# Patient Record
Sex: Male | Born: 1965 | Hispanic: No | Marital: Married | Smoking: Never smoker
Health system: Southern US, Community
[De-identification: ages and names within clinical notes are randomized; demographics above are authoritative.]

## PROBLEM LIST (undated history)

## (undated) DIAGNOSIS — H6120 Impacted cerumen, unspecified ear: Secondary | ICD-10-CM

## (undated) DIAGNOSIS — R079 Chest pain, unspecified: Secondary | ICD-10-CM

## (undated) DIAGNOSIS — N529 Male erectile dysfunction, unspecified: Secondary | ICD-10-CM

## (undated) DIAGNOSIS — C801 Malignant (primary) neoplasm, unspecified: Secondary | ICD-10-CM

## (undated) DIAGNOSIS — E039 Hypothyroidism, unspecified: Secondary | ICD-10-CM

## (undated) DIAGNOSIS — R0789 Other chest pain: Secondary | ICD-10-CM

## (undated) DIAGNOSIS — R5383 Other fatigue: Secondary | ICD-10-CM

## (undated) DIAGNOSIS — M1990 Unspecified osteoarthritis, unspecified site: Secondary | ICD-10-CM

## (undated) HISTORY — DX: Unspecified osteoarthritis, unspecified site: M19.90

## (undated) HISTORY — PX: HERNIA REPAIR: SHX51

## (undated) HISTORY — DX: Other fatigue: R53.83

## (undated) HISTORY — PX: THYROIDECTOMY: SHX17

## (undated) HISTORY — DX: Chest pain, unspecified: R07.9

## (undated) HISTORY — DX: Other chest pain: R07.89

## (undated) HISTORY — DX: Malignant (primary) neoplasm, unspecified: C80.1

## (undated) HISTORY — DX: Male erectile dysfunction, unspecified: N52.9

## (undated) HISTORY — DX: Impacted cerumen, unspecified ear: H61.20

## (undated) HISTORY — PX: APPENDECTOMY: SHX54

---

## 2016-01-31 ENCOUNTER — Other Ambulatory Visit (HOSPITAL_COMMUNITY): Payer: Self-pay | Admitting: Internal Medicine

## 2016-01-31 DIAGNOSIS — R079 Chest pain, unspecified: Secondary | ICD-10-CM

## 2016-02-06 ENCOUNTER — Ambulatory Visit (INDEPENDENT_AMBULATORY_CARE_PROVIDER_SITE_OTHER): Payer: 59

## 2016-02-06 DIAGNOSIS — R079 Chest pain, unspecified: Secondary | ICD-10-CM

## 2016-02-06 LAB — EXERCISE TOLERANCE TEST
CHL CUP STRESS STAGE 1 GRADE: 0 %
CHL CUP STRESS STAGE 1 SPEED: 0 mph
CHL CUP STRESS STAGE 2 GRADE: 0 %
CHL CUP STRESS STAGE 2 HR: 80 {beats}/min
CHL CUP STRESS STAGE 2 SPEED: 1 mph
CHL CUP STRESS STAGE 3 GRADE: 0 %
CHL CUP STRESS STAGE 3 HR: 82 {beats}/min
CHL CUP STRESS STAGE 4 DBP: 70 mmHg
CHL CUP STRESS STAGE 4 SPEED: 1.7 mph
CHL CUP STRESS STAGE 5 GRADE: 12 %
CHL CUP STRESS STAGE 5 HR: 139 {beats}/min
CHL CUP STRESS STAGE 6 DBP: 72 mmHg
CHL CUP STRESS STAGE 6 GRADE: 14 %
CHL CUP STRESS STAGE 6 SPEED: 3.4 mph
CHL CUP STRESS STAGE 7 GRADE: 16 %
CHL CUP STRESS STAGE 7 SPEED: 4.2 mph
CHL CUP STRESS STAGE 8 GRADE: 0 %
CHL CUP STRESS STAGE 8 SPEED: 1.5 mph
CHL CUP STRESS STAGE 9 GRADE: 0 %
CHL CUP STRESS STAGE 9 HR: 100 {beats}/min
CHL CUP STRESS STAGE 9 SBP: 154 mmHg
CHL CUP STRESS STAGE 9 SPEED: 0 mph
Estimated workload: 13.4 METS
Exercise duration (min): 11 min
Exercise duration (sec): 0 s
MPHR: 169 {beats}/min
Peak HR: 166 {beats}/min
Percent HR: 98 %
Percent of predicted max HR: 98 %
RPE: 16
Rest HR: 59 {beats}/min
Stage 1 DBP: 86 mmHg
Stage 1 HR: 61 {beats}/min
Stage 1 SBP: 147 mmHg
Stage 3 Speed: 1 mph
Stage 4 Grade: 10 %
Stage 4 HR: 111 {beats}/min
Stage 4 SBP: 167 mmHg
Stage 5 DBP: 69 mmHg
Stage 5 SBP: 169 mmHg
Stage 5 Speed: 2.5 mph
Stage 6 HR: 151 {beats}/min
Stage 6 SBP: 172 mmHg
Stage 7 HR: 166 {beats}/min
Stage 8 DBP: 72 mmHg
Stage 8 HR: 139 {beats}/min
Stage 8 SBP: 158 mmHg
Stage 9 DBP: 75 mmHg

## 2017-02-12 ENCOUNTER — Other Ambulatory Visit: Payer: Self-pay | Admitting: Gastroenterology

## 2017-04-17 ENCOUNTER — Encounter (HOSPITAL_COMMUNITY): Payer: Self-pay

## 2017-04-21 ENCOUNTER — Ambulatory Visit (HOSPITAL_COMMUNITY): Payer: 59 | Admitting: Certified Registered Nurse Anesthetist

## 2017-04-21 ENCOUNTER — Encounter (HOSPITAL_COMMUNITY)
Admission: RE | Disposition: A | Discharge status: Home / Self Care | Payer: Self-pay | Source: Ambulatory Visit | Attending: Gastroenterology

## 2017-04-21 ENCOUNTER — Ambulatory Visit (HOSPITAL_COMMUNITY)
Admission: RE | Admit: 2017-04-21 | Discharge: 2017-04-21 | Disposition: A | Discharge status: Home / Self Care | Payer: 59 | Source: Ambulatory Visit | Attending: Gastroenterology | Admitting: Gastroenterology

## 2017-04-21 ENCOUNTER — Encounter (HOSPITAL_COMMUNITY): Payer: Self-pay | Admitting: *Deleted

## 2017-04-21 DIAGNOSIS — I10 Essential (primary) hypertension: Secondary | ICD-10-CM | POA: Insufficient documentation

## 2017-04-21 DIAGNOSIS — E039 Hypothyroidism, unspecified: Secondary | ICD-10-CM | POA: Diagnosis not present

## 2017-04-21 DIAGNOSIS — Z85850 Personal history of malignant neoplasm of thyroid: Secondary | ICD-10-CM | POA: Diagnosis not present

## 2017-04-21 DIAGNOSIS — Z1211 Encounter for screening for malignant neoplasm of colon: Secondary | ICD-10-CM | POA: Insufficient documentation

## 2017-04-21 HISTORY — DX: Hypothyroidism, unspecified: E03.9

## 2017-04-21 HISTORY — PX: COLONOSCOPY WITH PROPOFOL: SHX5780

## 2017-04-21 SURGERY — COLONOSCOPY WITH PROPOFOL
Anesthesia: Monitor Anesthesia Care

## 2017-04-21 MED ORDER — SODIUM CHLORIDE 0.9 % IV SOLN: INTRAVENOUS | Status: DC

## 2017-04-21 MED ORDER — PROPOFOL 500 MG/50ML IV EMUL
INTRAVENOUS | Status: DC | PRN
  Administered 2017-04-21: 150 ug/kg/min via INTRAVENOUS

## 2017-04-21 MED ORDER — PROPOFOL 10 MG/ML IV BOLUS
INTRAVENOUS | Status: AC
  Filled 2017-04-21: qty 20

## 2017-04-21 MED ORDER — LACTATED RINGERS IV SOLN
INTRAVENOUS | Status: DC
  Administered 2017-04-21: 10:00:00 via INTRAVENOUS

## 2017-04-21 MED ORDER — PROPOFOL 10 MG/ML IV BOLUS
INTRAVENOUS | Status: DC | PRN
  Administered 2017-04-21 (×3): 20 mg via INTRAVENOUS
  Administered 2017-04-21: 40 mg via INTRAVENOUS
  Administered 2017-04-21: 50 mg via INTRAVENOUS
  Administered 2017-04-21: 20 mg via INTRAVENOUS

## 2017-04-21 SURGICAL SUPPLY — 21 items

## 2017-04-21 NOTE — Anesthesia Preprocedure Evaluation (Addendum)
Anesthesia Evaluation  Patient identified by MRN, date of birth, ID band Patient awake    Reviewed: Allergy & Precautions, NPO status , Patient's Chart, lab work & pertinent test results  Airway Mallampati: III  TM Distance: <3 FB Neck ROM: Limited    Dental no notable dental hx.    Pulmonary neg pulmonary ROS,    Pulmonary exam normal breath sounds clear to auscultation       Cardiovascular negative cardio ROS Normal cardiovascular exam Rhythm:Regular Rate:Normal     Neuro/Psych negative neurological ROS  negative psych ROS   GI/Hepatic negative GI ROS, Neg liver ROS,   Endo/Other  negative endocrine ROSHypothyroidism   Renal/GU negative Renal ROS  negative genitourinary   Musculoskeletal negative musculoskeletal ROS (+)   Abdominal   Peds negative pediatric ROS (+)  Hematology negative hematology ROS (+)   Anesthesia Other Findings   Reproductive/Obstetrics negative OB ROS                            Anesthesia Physical Anesthesia Plan  ASA: II  Anesthesia Plan: MAC   Post-op Pain Management:    Induction: Intravenous  Airway Management Planned:   Additional Equipment:   Intra-op Plan:   Post-operative Plan:   Informed Consent: I have reviewed the patients History and Physical, chart, labs and discussed the procedure including the risks, benefits and alternatives for the proposed anesthesia with the patient or authorized representative who has indicated his/her understanding and acceptance.   Dental advisory given  Plan Discussed with: CRNA and Surgeon  Anesthesia Plan Comments:         Anesthesia Quick Evaluation

## 2017-04-21 NOTE — Op Note (Signed)
[facility] Patient Name: Hector Combs Procedure Date: 04/21/2017 MRN: [id number] Attending MD: Garlan Fair , MD Date of Birth: 1966/05/30 CSN: [id number] Age: 51 Admit Type: Outpatient Procedure:                Colonoscopy Indications:              Screening for colorectal malignant neoplasm Providers:                Garlan Fair, MD, Laverta Baltimore RN, RN,                            Cherylynn Ridges, Technician, Heide Scales, CRNA Referring MD:              Medicines:                Propofol per Anesthesia Complications:            No immediate complications. Estimated Blood Loss:     Estimated blood loss: none. Procedure:                Pre-Anesthesia Assessment:                           - Prior to the procedure, a History and Physical                            was performed, and patient medications and                            allergies were reviewed. The patient's tolerance of                            previous anesthesia was also reviewed. The risks                            and benefits of the procedure and the sedation                            options and risks were discussed with the patient.                            All questions were answered, and informed consent                            was obtained. Prior Anticoagulants: The patient has                            taken no previous anticoagulant or antiplatelet                            agents. ASA Grade Assessment: II - A patient with                            mild systemic disease. After reviewing the risks  and benefits, the patient was deemed in                            satisfactory condition to undergo the procedure.                           After obtaining informed consent, the colonoscope                            was passed under direct vision. Throughout the                            procedure, the patient's blood pressure, pulse, and                   oxygen saturations were monitored continuously. The                            EC-[id number] ([id number]) scope was introduced through                            the anus and advanced to the the cecum, identified                            by appendiceal orifice and ileocecal valve. The                            colonoscopy was performed without difficulty. The                            patient tolerated the procedure well. The quality                            of the bowel preparation was good. The terminal                            ileum, the ileocecal valve, the appendiceal orifice                            and the rectum were photographed. Scope In: 10:28:21 AM Scope Out: 10:41:30 AM Scope Withdrawal Time: 0 hours 9 minutes 2 seconds  Total Procedure Duration: 0 hours 13 minutes 9 seconds  Findings:      The perianal and digital rectal examinations were normal.      The entire examined colon appeared normal. Impression:               - The entire examined colon is normal.                           - No specimens collected. Moderate Sedation:      N/A- Per Anesthesia Care Recommendation:           - Patient has a contact number available for                            emergencies. The signs  and symptoms of potential                            delayed complications were discussed with the                            patient. Return to normal activities tomorrow.                            Written discharge instructions were provided to the                            patient.                           - Repeat colonoscopy in 10 years for screening                            purposes.                           - Resume previous diet.                           - Continue present medications. Procedure Code(s):        --- Professional ---                           Y2482, Colorectal cancer screening; colonoscopy on                            individual not meeting criteria  for high risk Diagnosis Code(s):        --- Professional ---                           Z12.11, Encounter for screening for malignant                            neoplasm of colon CPT copyright 2016 [employer]. All rights reserved. The codes documented in this report are preliminary and upon coder review may  be revised to meet current compliance requirements. Earle Gell, MD Garlan Fair, MD 04/21/2017 10:46:26 AM This report has been signed electronically. Number of Addenda: 0

## 2017-04-21 NOTE — H&P (Signed)
Procedure: Baseline screening colonoscopy.  History: The patient is a 51 year old male born 07/23/1966. He is scheduled to undergo a screening colonoscopy with polypectomy to prevent colon cancer.  Past medical history: Thyroidectomy performed to treat thyroid cancer in 2002. Appendectomy. Hernia repair. Hypertension.  Medication allergies: Sertraline caused insomnia  Exam: The patient is alert and lying comfortably on the endoscopy stretcher. Abdomen is soft and nontender to palpation. Lungs are clear to auscultation. Cardiac exam reveals a regular rhythm.  Plan: Proceed with screening colonoscopy

## 2017-04-21 NOTE — Transfer of Care (Signed)
Immediate Anesthesia Transfer of Care Note  Patient: Hector Combs  Procedure(s) Performed: Procedure(s): COLONOSCOPY WITH PROPOFOL (N/A)  Patient Location: PACU  Anesthesia Type:MAC  Level of Consciousness: Patient easily awoken, sedated, comfortable, cooperative, following commands, responds to stimulation.   Airway & Oxygen Therapy: Patient spontaneously breathing, ventilating well, oxygen via simple oxygen mask.  Post-op Assessment: Report given to PACU RN, vital signs reviewed and stable, moving all extremities.   Post vital signs: Reviewed and stable.  Complications: No apparent anesthesia complications  Last Vitals:  Vitals:   04/21/17 0930  BP: (!) 152/88  Pulse: 87  Resp: 18  Temp: 36.7 C    Last Pain:  Vitals:   04/21/17 0930  TempSrc: Oral         Complications: No apparent anesthesia complications

## 2017-04-21 NOTE — Anesthesia Postprocedure Evaluation (Signed)
Anesthesia Post Note  Patient: Hector Combs  Procedure(s) Performed: Procedure(s) (LRB): COLONOSCOPY WITH PROPOFOL (N/A)  Patient location during evaluation: PACU Anesthesia Type: MAC Level of consciousness: awake and alert Pain management: pain level controlled Vital Signs Assessment: post-procedure vital signs reviewed and stable Respiratory status: spontaneous breathing, nonlabored ventilation, respiratory function stable and patient connected to nasal cannula oxygen Cardiovascular status: stable and blood pressure returned to baseline Anesthetic complications: no       Last Vitals:  Vitals:   04/21/17 1100 04/21/17 1110  BP: 129/81 130/83  Pulse: 73 66  Resp: 13 10  Temp:      Last Pain:  Vitals:   04/21/17 1048  TempSrc: Oral                 [name] P [name]

## 2017-04-21 NOTE — Discharge Instructions (Signed)

## 2017-04-22 ENCOUNTER — Encounter (HOSPITAL_COMMUNITY): Payer: Self-pay | Admitting: Gastroenterology

## 2020-07-21 ENCOUNTER — Ambulatory Visit: Payer: No Typology Code available for payment source

## 2020-07-21 ENCOUNTER — Other Ambulatory Visit: Payer: Self-pay

## 2020-07-21 ENCOUNTER — Other Ambulatory Visit: Payer: Self-pay | Admitting: Internal Medicine

## 2020-07-21 ENCOUNTER — Ambulatory Visit
Admission: RE | Admit: 2020-07-21 | Discharge: 2020-07-21 | Disposition: A | Discharge status: Home / Self Care | Payer: No Typology Code available for payment source | Source: Ambulatory Visit | Attending: Internal Medicine | Admitting: Internal Medicine

## 2020-07-21 DIAGNOSIS — M546 Pain in thoracic spine: Secondary | ICD-10-CM

## 2020-07-25 ENCOUNTER — Other Ambulatory Visit: Payer: Self-pay | Admitting: Internal Medicine

## 2020-07-26 ENCOUNTER — Other Ambulatory Visit: Payer: Self-pay | Admitting: Internal Medicine

## 2020-07-26 DIAGNOSIS — M79602 Pain in left arm: Secondary | ICD-10-CM

## 2020-07-26 DIAGNOSIS — M5412 Radiculopathy, cervical region: Secondary | ICD-10-CM

## 2020-07-30 ENCOUNTER — Other Ambulatory Visit: Payer: Self-pay

## 2020-07-30 ENCOUNTER — Ambulatory Visit
Admission: RE | Admit: 2020-07-30 | Discharge: 2020-07-30 | Disposition: A | Discharge status: Home / Self Care | Payer: No Typology Code available for payment source | Source: Ambulatory Visit | Attending: Internal Medicine | Admitting: Internal Medicine

## 2020-07-30 DIAGNOSIS — M5412 Radiculopathy, cervical region: Secondary | ICD-10-CM

## 2020-07-30 DIAGNOSIS — M79602 Pain in left arm: Secondary | ICD-10-CM

## 2020-08-18 ENCOUNTER — Ambulatory Visit (INDEPENDENT_AMBULATORY_CARE_PROVIDER_SITE_OTHER): Payer: No Typology Code available for payment source | Admitting: Otolaryngology

## 2020-08-23 ENCOUNTER — Ambulatory Visit (INDEPENDENT_AMBULATORY_CARE_PROVIDER_SITE_OTHER): Payer: No Typology Code available for payment source | Admitting: Otolaryngology

## 2021-04-18 ENCOUNTER — Ambulatory Visit: Payer: No Typology Code available for payment source | Attending: Internal Medicine

## 2021-04-18 DIAGNOSIS — Z23 Encounter for immunization: Secondary | ICD-10-CM

## 2021-04-18 NOTE — Progress Notes (Signed)
   Covid-19 Vaccination Clinic  Name:  Hector Combs    MRN: [id number] DOB: 08/19/1966  04/18/2021  [name] was observed post Covid-19 immunization for 15 minutes without incident. He was provided with Vaccine Information Sheet and instruction to access the V-Safe system.   [name] was instructed to call 911 with any severe reactions post vaccine: Marland Kitchen Difficulty breathing  . Swelling of face and throat  . A fast heartbeat  . A bad rash all over body  . Dizziness and weakness

## 2021-04-23 ENCOUNTER — Other Ambulatory Visit (HOSPITAL_BASED_OUTPATIENT_CLINIC_OR_DEPARTMENT_OTHER): Payer: Self-pay

## 2021-04-23 MED ORDER — JANSSEN COVID-19 VACCINE 0.5 ML IM SUSP
INTRAMUSCULAR | 0 refills | Status: AC
  Filled 2021-04-23: qty 0.5, 1d supply, fill #0

## 2022-02-03 IMAGING — MR MR CERVICAL SPINE W/O CM
4 of 5 series · 28 of 48 positions shown · non-contrast
Comparison: 08/07/2009 CT

CLINICAL DATA: Neck pain with no injury. Numbness in the left arm
after driving

EXAM:
MRI CERVICAL SPINE WITHOUT CONTRAST
TECHNIQUE: Multiplanar, multisequence MR imaging of the cervical spine was
performed. No intravenous contrast was administered.

[Series 3: T2 · sagittal · 3.0mm · 0.66mm/px · 6 of 13 slices shown (1 of 2)]
[im 1/13]
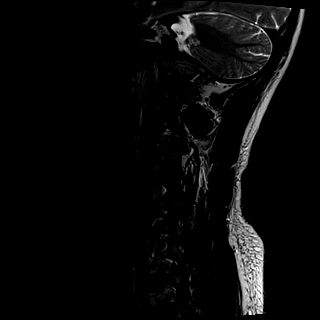
[im 3/13]
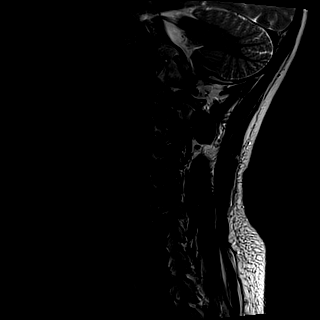
[im 5/13]
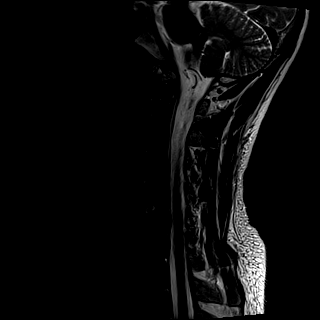
[im 8/13]
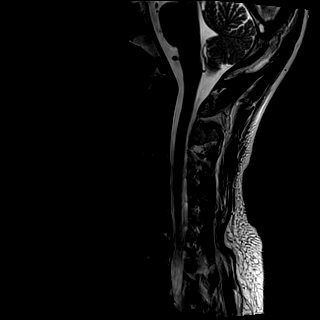
[im 10/13]
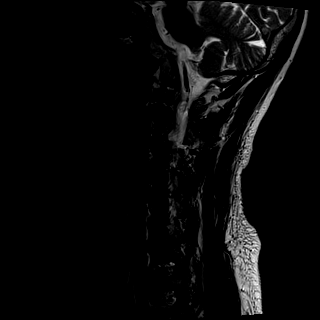
[im 13/13]
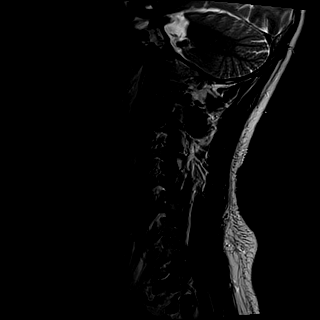

[Series 4: T1 · sagittal · 3.0mm · 0.41mm/px · 7 of 13 slices shown]
[im 1/13]
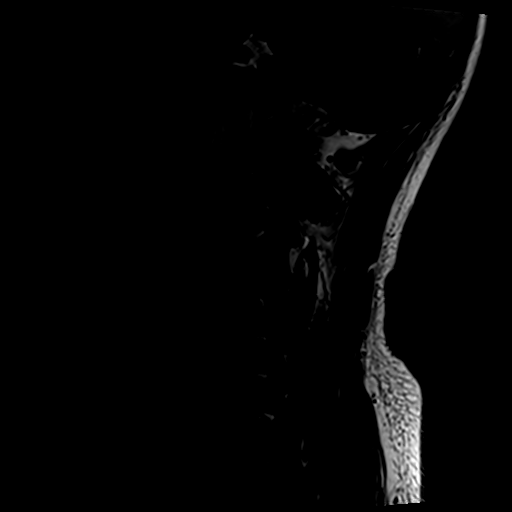
[im 3/13]
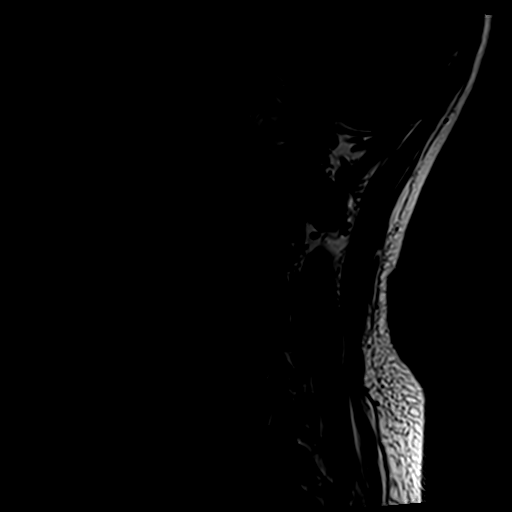
[im 5/13]
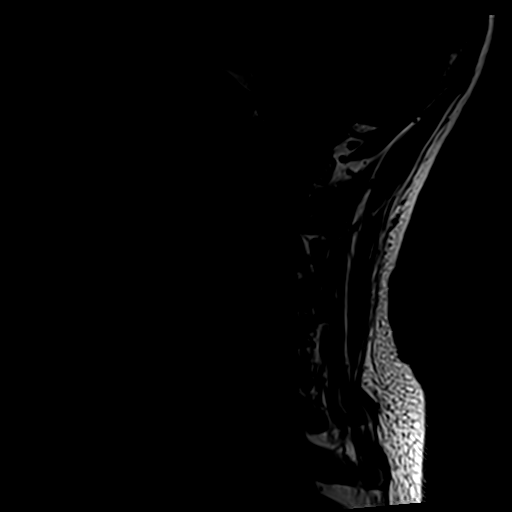
[im 7/13]
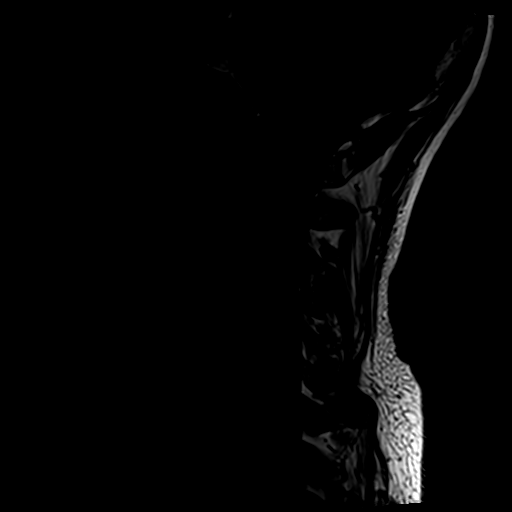
[im 9/13]
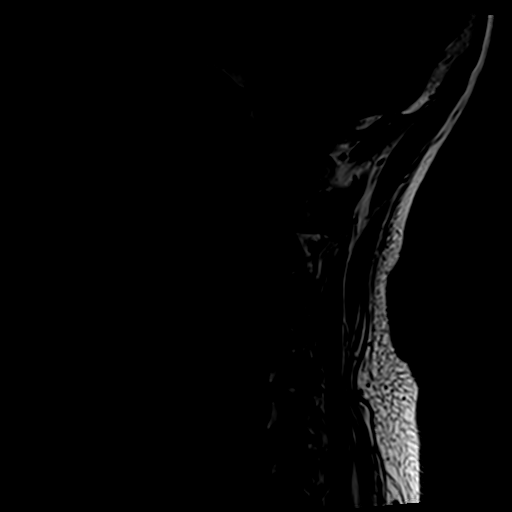
[im 11/13]
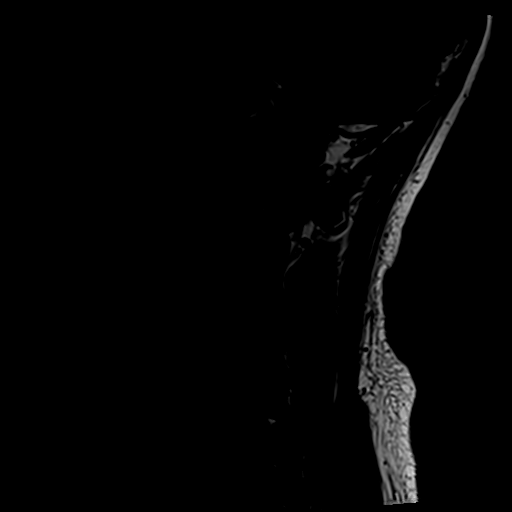
[im 13/13]
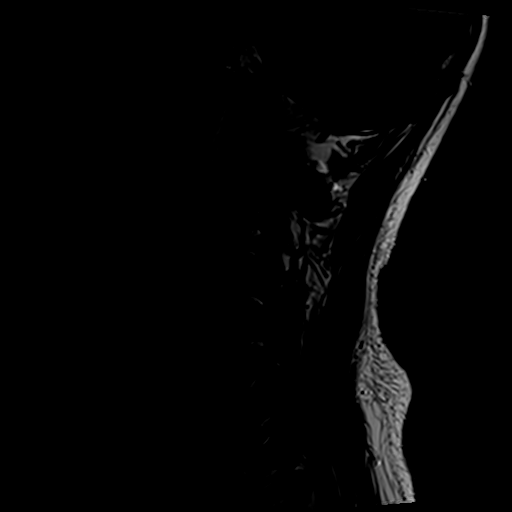

[Series 5: tir sag · sagittal · 3.0mm · 0.41mm/px · 7 of 13 slices shown]
[im 1/13]
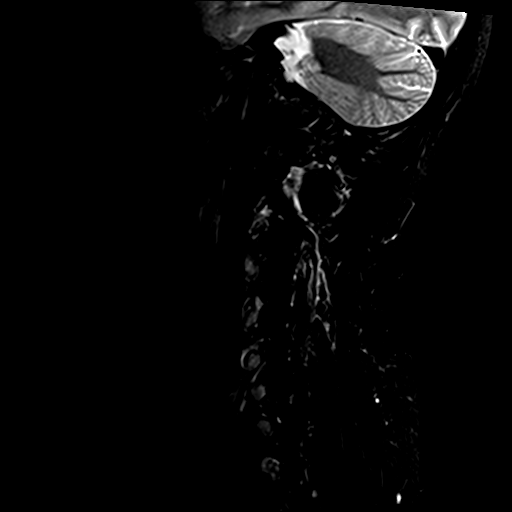
[im 3/13]
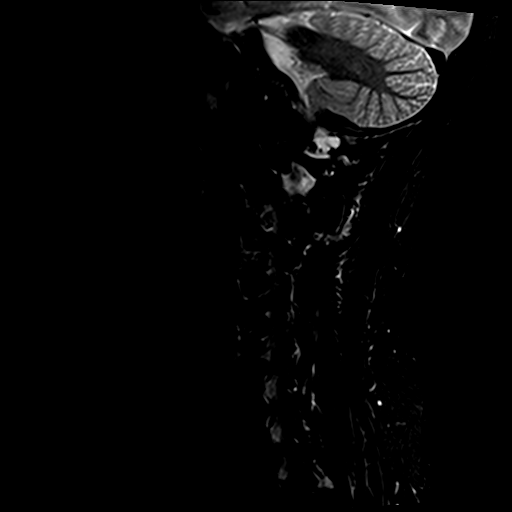
[im 5/13]
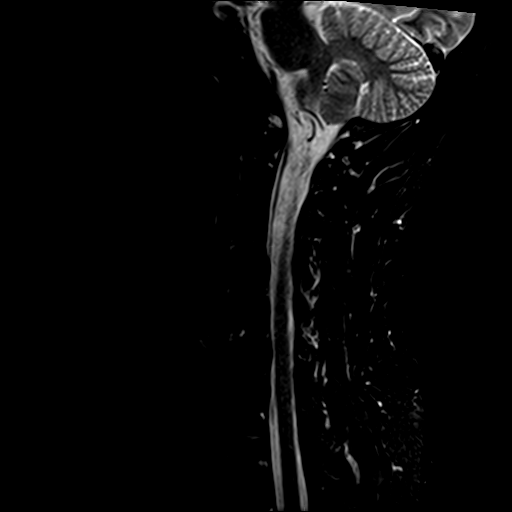
[im 7/13]
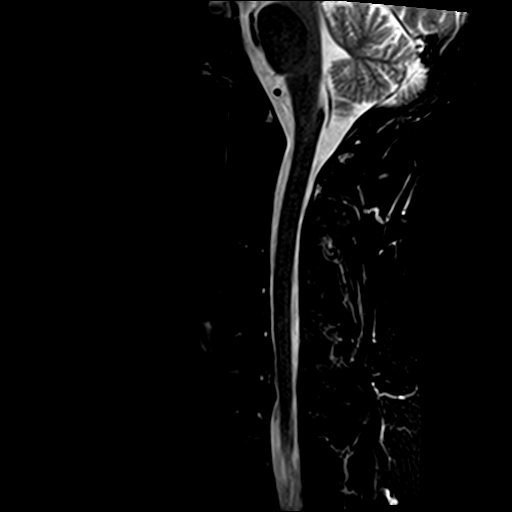
[im 9/13]
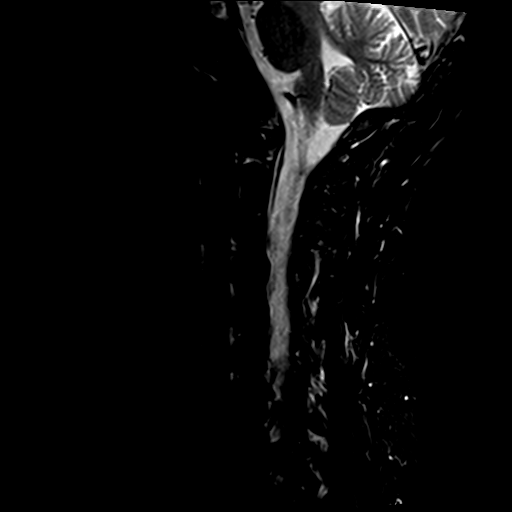
[im 11/13]
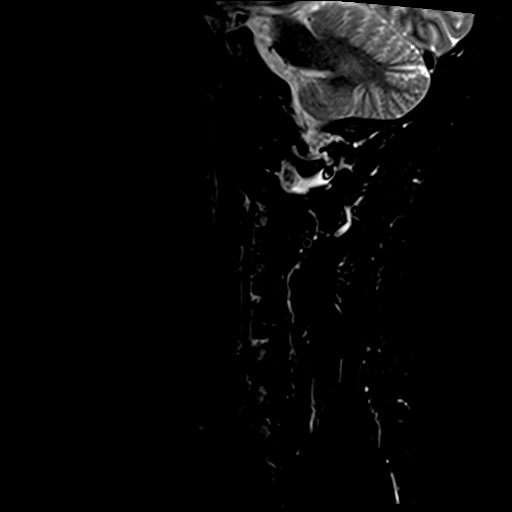
[im 13/13]
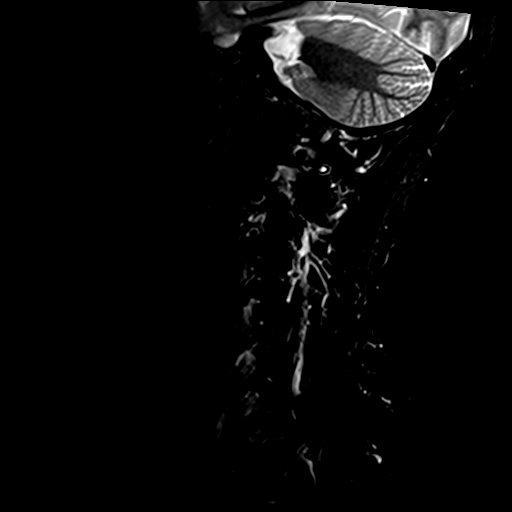

[Series 7: T2 · axial · 3.0mm · 0.70mm/px · z∈[-67,+31]mm · 8 of 28 slices shown (2 of 2)]
[im 1/28]
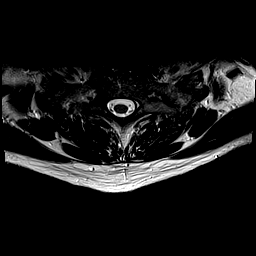
[im 5/28]
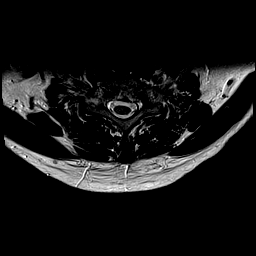
[im 9/28]
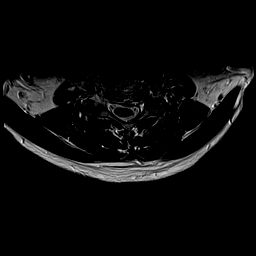
[im 13/28]
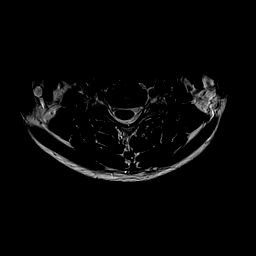
[im 15/28]
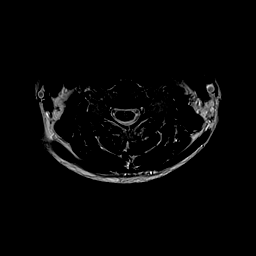
[im 19/28]
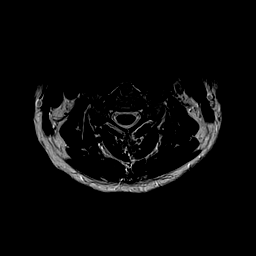
[im 23/28]
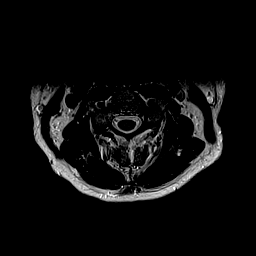
[im 28/28]
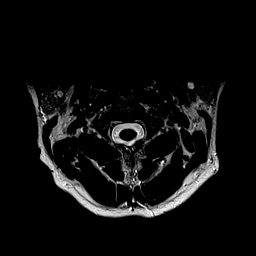

[28 of 48 positions shown; findings below may reference images not displayed]

FINDINGS: Alignment: Straightening of the cervical spine

Vertebrae: No fracture, evidence of discitis, or bone lesion.

Cord: Normal signal and morphology.

Posterior Fossa, vertebral arteries, paraspinal tissues: Negative.

Disc levels:

C2-3: Unremarkable.

C3-4: Tiny uncovertebral spurs.  No impingement

C4-5: Disc narrowing and borderline facet spurring. Mild to moderate
bilateral foraminal narrowing.

C5-6: Disc narrowing with small endplate spurs. The canal and
foramina remain patent

C6-7: Disc narrowing with left paracentral to foraminal protrusion
and buttressing osteophytes. Left foraminal impingement. Mild
posterior displacement of the left cord.

C7-T1:Unremarkable.
IMPRESSION: 1. C6-7 degenerative left foraminal impingement and mild crowding of
the left canal.
2. C4-5 mild to moderate left more than right foraminal narrowing.

## 2022-02-18 ENCOUNTER — Other Ambulatory Visit: Payer: Self-pay | Admitting: Internal Medicine

## 2022-02-18 DIAGNOSIS — M5416 Radiculopathy, lumbar region: Secondary | ICD-10-CM

## 2022-02-19 ENCOUNTER — Other Ambulatory Visit: Payer: Self-pay | Admitting: Internal Medicine

## 2022-02-19 DIAGNOSIS — M5416 Radiculopathy, lumbar region: Secondary | ICD-10-CM

## 2022-03-12 ENCOUNTER — Other Ambulatory Visit: Payer: No Typology Code available for payment source

## 2023-04-10 ENCOUNTER — Other Ambulatory Visit: Payer: Self-pay

## 2023-04-10 DIAGNOSIS — S46212A Strain of muscle, fascia and tendon of other parts of biceps, left arm, initial encounter: Secondary | ICD-10-CM

## 2023-04-15 ENCOUNTER — Other Ambulatory Visit: Payer: Self-pay | Admitting: Sports Medicine

## 2023-04-15 DIAGNOSIS — S46212A Strain of muscle, fascia and tendon of other parts of biceps, left arm, initial encounter: Secondary | ICD-10-CM

## 2023-04-24 ENCOUNTER — Other Ambulatory Visit: Payer: No Typology Code available for payment source

## 2023-04-29 ENCOUNTER — Other Ambulatory Visit: Payer: Self-pay | Admitting: Sports Medicine

## 2023-04-29 ENCOUNTER — Ambulatory Visit
Admission: RE | Admit: 2023-04-29 | Discharge: 2023-04-29 | Disposition: A | Discharge status: Home / Self Care | Payer: 59 | Source: Ambulatory Visit | Attending: Sports Medicine | Admitting: Sports Medicine

## 2023-04-29 DIAGNOSIS — S46212A Strain of muscle, fascia and tendon of other parts of biceps, left arm, initial encounter: Secondary | ICD-10-CM
